# Patient Record
Sex: Female | Born: 1965 | Race: White | Hispanic: No | Marital: Married | Smoking: Never smoker
Health system: Southern US, Community
[De-identification: ages and names within clinical notes are randomized; demographics above are authoritative.]

---
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
## 1997-07-24 ENCOUNTER — Other Ambulatory Visit: Admission: RE | Admit: 1997-07-24 | Discharge: 1997-07-24 | Payer: Self-pay | Admitting: Obstetrics & Gynecology

## 2000-03-29 ENCOUNTER — Other Ambulatory Visit: Admission: RE | Admit: 2000-03-29 | Discharge: 2000-03-29 | Payer: Self-pay | Admitting: Obstetrics & Gynecology

## 2000-10-28 ENCOUNTER — Ambulatory Visit (HOSPITAL_COMMUNITY): Admission: RE | Admit: 2000-10-28 | Discharge: 2000-10-28 | Payer: Self-pay | Admitting: Obstetrics & Gynecology

## 2001-01-20 ENCOUNTER — Inpatient Hospital Stay (HOSPITAL_COMMUNITY): Admission: AD | Admit: 2001-01-20 | Discharge: 2001-01-22 | Payer: Self-pay | Admitting: Obstetrics & Gynecology

## 2001-02-18 ENCOUNTER — Other Ambulatory Visit: Admission: RE | Admit: 2001-02-18 | Discharge: 2001-02-18 | Payer: Self-pay | Admitting: Obstetrics & Gynecology

## 2002-03-13 ENCOUNTER — Other Ambulatory Visit: Admission: RE | Admit: 2002-03-13 | Discharge: 2002-03-13 | Payer: Self-pay | Admitting: Obstetrics & Gynecology

## 2003-04-09 ENCOUNTER — Other Ambulatory Visit: Admission: RE | Admit: 2003-04-09 | Discharge: 2003-04-09 | Payer: Self-pay | Admitting: Obstetrics & Gynecology

## 2004-04-14 ENCOUNTER — Other Ambulatory Visit: Admission: RE | Admit: 2004-04-14 | Discharge: 2004-04-14 | Payer: Self-pay | Admitting: Obstetrics & Gynecology

## 2005-05-26 ENCOUNTER — Other Ambulatory Visit: Admission: RE | Admit: 2005-05-26 | Discharge: 2005-05-26 | Payer: Self-pay | Admitting: Obstetrics & Gynecology

## 2015-06-06 ENCOUNTER — Ambulatory Visit (INDEPENDENT_AMBULATORY_CARE_PROVIDER_SITE_OTHER): Payer: BLUE CROSS/BLUE SHIELD | Admitting: Medical

## 2015-06-06 ENCOUNTER — Encounter: Payer: Self-pay | Admitting: Medical

## 2015-06-06 VITALS — BP 110/76 | HR 88 | Temp 97.9°F | Ht 67.0 in | Wt 183.2 lb

## 2015-06-06 DIAGNOSIS — R51 Headache: Secondary | ICD-10-CM

## 2015-06-06 DIAGNOSIS — G44209 Tension-type headache, unspecified, not intractable: Secondary | ICD-10-CM

## 2015-06-06 DIAGNOSIS — R519 Headache, unspecified: Secondary | ICD-10-CM | POA: Insufficient documentation

## 2015-06-06 MED ORDER — DICLOFENAC SODIUM 75 MG PO TBEC: DELAYED_RELEASE_TABLET | ORAL | Status: AC

## 2015-06-06 MED ORDER — KETOROLAC TROMETHAMINE 60 MG/2ML IM SOLN
60.0000 mg | Freq: Once | INTRAMUSCULAR | Status: AC
  Administered 2015-06-06: 60 mg via INTRAMUSCULAR

## 2015-06-06 MED ORDER — CYCLOBENZAPRINE HCL 10 MG PO TABS: ORAL_TABLET | ORAL | Status: AC

## 2015-06-06 NOTE — Progress Notes (Signed)
Subjective:    Patient ID: Adrienne Reynolds, female    DOB: Mar 02, 1966, 49 y.o.   MRN: [id number]  HPI   I have reviewed pt PMH, PSH, FH, Social History and Surgical History.  Pt [employer]), Exercise 2 times a week on the treadmill, Caffeinated beverages 2 a day, admits to eating out a lot, married- 2 children.   Pt in with ha recently for 10 days. She states pain is on back of head region. Pt thinks maybe tension ha. She can feel trapezius muscles tight. She does admit to worrying about everything. Stressed about son moving out of house. Also stressed over holidays and very busy at work.  Not sleeping. Pt states some ha associated with pt cycle last 4 months. Ha have bee usually week of her period.   LMP - was christmas day. Last year cycle have only been 2-3 days.   Pt last labs complete physical and lab work 8-9 years.  Pt up to date pap smear and mammogram.  Excedrin migraine by decreased ha about 50% the other day.    Review of Systems  Constitutional: Negative for fever, chills, diaphoresis, activity change and fatigue.  Respiratory: Negative for cough, chest tightness and shortness of breath.   Cardiovascular: Negative for chest pain, palpitations and leg swelling.  Gastrointestinal: Negative for nausea, vomiting and abdominal pain.  Musculoskeletal: Negative for neck pain and neck stiffness.  Neurological: Negative for dizziness, syncope, facial asymmetry, weakness, light-headedness and headaches.  Hematological: Negative for adenopathy. Does not bruise/bleed easily.  Psychiatric/Behavioral: Negative for behavioral problems, confusion and agitation. The patient is not nervous/anxious.     History reviewed. No pertinent past medical history.  Social History   Social History  . Marital Status: Married    Spouse Name: N/A  . Number of Children: N/A  . Years of Education: N/A   Occupational History  . Not on file.   Social History Main Topics  .  Smoking status: Never Smoker   . Smokeless tobacco: Never Used  . Alcohol Use: 0.0 oz/week    0 Standard drinks or equivalent per week     Comment: occasionally wine. 2 glasses of wine a month.  . Drug Use: No  . Sexual Activity: Yes   Other Topics Concern  . Not on file   Social History Narrative  . No narrative on file    Past Surgical History  Procedure Laterality Date  . Cesarean section      05/08/1996  . Cesarean section      01/06/2001  . Tonsilectomy, adenoidectomy, bilateral myringotomy and tubes  1973    Family History  Problem Relation Age of Onset  . Thyroid disease Mother   . Colon cancer Mother   . Arthritis/Rheumatoid Mother   . AAA (abdominal aortic aneurysm) Mother   . Hypertension Father     No Known Allergies  No current outpatient prescriptions on file prior to visit.   No current facility-administered medications on file prior to visit.    BP 110/76 mmHg  Pulse 88  Temp(Src) 97.9 F (36.6 C) (Oral)  Ht 5\' 7"  (1.702 m)  Wt 183 lb 3.2 oz (83.099 kg)  BMI 28.69 kg/m2  SpO2 99%  LMP 05/30/2015       Objective:   Physical Exam  General Mental Status- Alert. General Appearance- Not in acute distress.   Skin General: Color- Normal Color. Moisture- Normal Moisture. Temporal area normal. No veins dilated. Nontender.  Neck Carotid Arteries-  Normal color. Moisture- Normal Moisture. No carotid bruits. No JVD. Both sides trapezius tender. Worse on left side. Pain where trapezius inserts into occipital area.  Chest and Lung Exam Auscultation: Breath Sounds:-Normal.  Cardiovascular Auscultation:Rythm- Regular. Murmurs & Other Heart Sounds:Auscultation of the heart reveals- No Murmurs.  Abdomen Inspection:-Inspeection Normal. Palpation/Percussion:Note:No mass. Palpation and Percussion of the abdomen reveal- Non Tender, Non Distended + BS, no rebound or guarding.    Neurologic Cranial Nerve exam:- CN III-XII intact(No nystagmus),  symmetric smile. Drift Test:- No drift. Romberg Exam:- Negative.  Finger to Nose:- Normal/Intact Strength:- 5/5 equal and symmetric strength both upper and lower extremities.      Assessment & Plan:  By history description  and exam you do appear to have tension ha.  We gave you toradol 60 mg im today.  Tonight your could also take cyclobenzaprine muscle relaxant.  I will prescribe diclofenac for mild ha if you have during the day. And if at end of the day you feel neck muscles tightness then try muscle relaxant.  We need to follow your response to med to help determine if imaging of head indicated or if neurologist referral indicated.  If you get HA with any neurologic signs or symptoms as discussed then ED evaluation.  If you get ha with severe stiff neck then ED evaluation as well.  Follow up in one week or as needed.  Also recommend in near future month or so scheduling for complete physical to include fasting labs.

## 2015-06-06 NOTE — Patient Instructions (Addendum)
By history description  and exam you do appear to have tension ha.  We gave you toradol 60 mg im today.  Tonight your could also take cyclobenzaprine muscle relaxant.  I will prescribe diclofenac for mild ha if you have during the day. And if at end of the day you feel neck muscles tightness then try muscle relaxant.  We need to follow your response to med to help determine if imaging of head indicated or if neurologist referral indicated.  If you get HA with any neurologic signs or symptoms as discussed then ED evaluation.  If you get ha with severe stiff neck then ED evaluation as well.  Follow up in one week or as needed.  Also recommend in near future month or so scheduling for complete physical to include fasting labs.

## 2015-06-06 NOTE — Progress Notes (Signed)
Pre visit review using our clinic review tool, if applicable. No additional management support is needed unless otherwise documented below in the visit note. 

## 2015-06-13 ENCOUNTER — Ambulatory Visit (INDEPENDENT_AMBULATORY_CARE_PROVIDER_SITE_OTHER): Payer: BLUE CROSS/BLUE SHIELD | Admitting: Medical

## 2015-06-13 ENCOUNTER — Encounter: Payer: Self-pay | Admitting: Medical

## 2015-06-13 VITALS — BP 112/70 | HR 81 | Temp 97.7°F | Ht 67.0 in | Wt 182.8 lb

## 2015-06-13 DIAGNOSIS — G44209 Tension-type headache, unspecified, not intractable: Secondary | ICD-10-CM | POA: Diagnosis not present

## 2015-06-13 NOTE — Progress Notes (Signed)
Subjective:    Patient ID: Adrienne Reynolds, female    DOB: [birth year], 50 y.o.   MRN: [id number]  HPI  Pt in for follow up.  Pt was in last time and  she had likely tension ha. Pt did respond to toradol. Took about 12 hours to stop the ha.   However she  states at end of each day she will have low level ha. Recenlty level 1/10 ha. No migraine features associated and not gross motor or sensory function deficits.   Before most recent severe ha(last visit) she only had mild brief ha  twice a year.(lasting briefly)  Pt has been very stressed having [place] [facility] [place] and stressed with work busy season.  I did give cyclobenzaprine for 4 nights. She slept well but needed to cut dose in half due to feeling oversedated.   Pt states each day after will feel ha toward end of day level 1/10.  Take diclofenac one time a day  Pt in past had ct of head 10 yrs ago and only showed sinusitis.  Pt does get massage every 8 wks. She will get massage next thursday    Review of Systems  Constitutional: Negative for fever, chills, diaphoresis, activity change and fatigue.  Respiratory: Negative for cough, chest tightness and shortness of breath.   Cardiovascular: Negative for chest pain, palpitations and leg swelling.  Gastrointestinal: Negative for nausea, vomiting and abdominal pain.  Musculoskeletal: Negative for neck pain and neck stiffness.  Neurological: Negative for dizziness, weakness and headaches.       No current ha. She has slight ha after work today. She took one excedrin and stopped completley. Again was 1/10 level ha.  Psychiatric/Behavioral: Negative for behavioral problems, confusion and agitation. The patient is not nervous/anxious.     No past medical history on file.  Social History   Social History  . Marital Status: Married    Spouse Name: N/A  . Number of Children: N/A  . Years of Education: N/A   Occupational History  . Not on file.   Social  History Main Topics  . Smoking status: Never Smoker   . Smokeless tobacco: Never Used  . Alcohol Use: 0.0 oz/week    0 Standard drinks or equivalent per week     Comment: occasionally wine. 2 glasses of wine a month.  . Drug Use: No  . Sexual Activity: Yes   Other Topics Concern  . Not on file   Social History Narrative    Past Surgical History  Procedure Laterality Date  . Cesarean section      05/08/1996  . Cesarean section      01/06/2001  . Tonsilectomy, adenoidectomy, bilateral myringotomy and tubes  1973    Family History  Problem Relation Age of Onset  . Thyroid disease Mother   . Colon cancer Mother   . Arthritis/Rheumatoid Mother   . AAA (abdominal aortic aneurysm) Mother   . Hypertension Father     No Known Allergies  Current Outpatient Prescriptions on File Prior to Visit  Medication Sig Dispense Refill  . cyclobenzaprine (FLEXERIL) 10 MG tablet 1 tab po q hs prn trapezius pain with ha 10 tablet 0  . diclofenac (VOLTAREN) 75 MG EC tablet 1 tab po bid prn headache 14 tablet 0  . TRI-PREVIFEM 0.18/0.215/0.25 MG-35 MCG tablet TAKE 1 TABLET DAILY AS DIRECTED.  3   No current facility-administered medications on file prior to visit.    BP 112/70  mmHg  Pulse 81  Temp(Src) 97.7 F (36.5 C) (Oral)  Ht 5\' 7"  (1.702 m)  Wt 182 lb 12.8 oz (82.918 kg)  BMI 28.62 kg/m2  SpO2 98%  LMP 05/30/2015       Objective:   Physical Exam   General Mental Status- Alert. General Appearance- Not in acute distress.   Skin General: Color- Normal Color. Moisture- Normal Moisture.  Neck Carotid Arteries- Normal color. Moisture- Normal Moisture. No carotid bruits. No JVD.  Chest and Lung Exam Auscultation: Breath Sounds:-Normal.  Cardiovascular Auscultation:Rythm- Regular. Murmurs & Other Heart Sounds:Auscultation of the heart reveals- No Murmurs.   Neurologic Cranial Nerve exam:- CN III-XII intact(No nystagmus), symmetric smile. Strength:- 5/5 equal and  symmetric strength both upper and lower extremities.      Assessment & Plan:  Your ha is much improved but still present low grade on daily basis. I think as you adjust to stress related to your son being at college and you get through work busy season that ha should subside.  Continue to use diclofenac one time a day. And cyclobenzaprine as needed.  I do want to see you have complete ha free days. I am curious to see how you will respond to your massage next Thursday. Please update me around  January 17th how you are doing. I am still considering getting imaging studies.   Also recommend setting up wellness exam.  Note pt is aware that if ha increases/severe of ha with neurologic signs or symptoms as discussed then ED evaluation.

## 2015-06-13 NOTE — Patient Instructions (Signed)
Your ha is much improved but still present low grade on daily basis. I think as you adjust to stress related to your son being at college and you get through work busy season that ha should subside.  Continue to use diclofenac one time a day. And cyclobenzaprine as needed.  I do want to see you have complete ha free days. I am curious to see how you will respond to your massage next Thursday. Please update me around  January 17th how you are doing. I am still considering getting imaging studies.   Also recommend setting up wellness exam.

## 2015-06-13 NOTE — Progress Notes (Signed)
Pre visit review using our clinic review tool, if applicable. No additional management support is needed unless otherwise documented below in the visit note. 

## 2015-07-31 ENCOUNTER — Encounter: Payer: BLUE CROSS/BLUE SHIELD | Admitting: Medical

## 2015-10-20 ENCOUNTER — Emergency Department (HOSPITAL_BASED_OUTPATIENT_CLINIC_OR_DEPARTMENT_OTHER): Payer: BLUE CROSS/BLUE SHIELD

## 2015-10-20 ENCOUNTER — Emergency Department (HOSPITAL_BASED_OUTPATIENT_CLINIC_OR_DEPARTMENT_OTHER)
Admission: EM | Admit: 2015-10-20 | Discharge: 2015-10-20 | Disposition: A | Discharge status: Home / Self Care | Payer: BLUE CROSS/BLUE SHIELD | Attending: Emergency Medicine | Admitting: Emergency Medicine

## 2015-10-20 ENCOUNTER — Encounter (HOSPITAL_BASED_OUTPATIENT_CLINIC_OR_DEPARTMENT_OTHER): Payer: Self-pay | Admitting: Emergency Medicine

## 2015-10-20 DIAGNOSIS — M25512 Pain in left shoulder: Secondary | ICD-10-CM | POA: Insufficient documentation

## 2015-10-20 DIAGNOSIS — M542 Cervicalgia: Secondary | ICD-10-CM | POA: Diagnosis not present

## 2015-10-20 DIAGNOSIS — R0789 Other chest pain: Secondary | ICD-10-CM | POA: Insufficient documentation

## 2015-10-20 DIAGNOSIS — R079 Chest pain, unspecified: Secondary | ICD-10-CM

## 2015-10-20 DIAGNOSIS — R0602 Shortness of breath: Secondary | ICD-10-CM | POA: Insufficient documentation

## 2015-10-20 LAB — CBC
HEMATOCRIT: 42.5 % (ref 36.0–46.0)
Hemoglobin: 14.3 g/dL (ref 12.0–15.0)
MCH: 29.3 pg (ref 26.0–34.0)
MCHC: 33.6 g/dL (ref 30.0–36.0)
MCV: 87.1 fL (ref 78.0–100.0)
PLATELETS: 234 10*3/uL (ref 150–400)
RBC: 4.88 MIL/uL (ref 3.87–5.11)
RDW: 12.7 % (ref 11.5–15.5)
WBC: 5.3 10*3/uL (ref 4.0–10.5)

## 2015-10-20 LAB — COMPREHENSIVE METABOLIC PANEL
ALT: 16 U/L (ref 14–54)
ANION GAP: 10 (ref 5–15)
AST: 22 U/L (ref 15–41)
Albumin: 4.2 g/dL (ref 3.5–5.0)
Alkaline Phosphatase: 70 U/L (ref 38–126)
BILIRUBIN TOTAL: 0.4 mg/dL (ref 0.3–1.2)
BUN: 12 mg/dL (ref 6–20)
CO2: 26 mmol/L (ref 22–32)
Calcium: 9.4 mg/dL (ref 8.9–10.3)
Chloride: 103 mmol/L (ref 101–111)
Creatinine, Ser: 0.81 mg/dL (ref 0.44–1.00)
Glucose, Bld: 84 mg/dL (ref 65–99)
POTASSIUM: 3.8 mmol/L (ref 3.5–5.1)
Sodium: 139 mmol/L (ref 135–145)
TOTAL PROTEIN: 8.1 g/dL (ref 6.5–8.1)

## 2015-10-20 LAB — TROPONIN I

## 2015-10-20 MED ORDER — ASPIRIN 81 MG PO CHEW
324.0000 mg | CHEWABLE_TABLET | Freq: Once | ORAL | Status: AC
Start: 2015-10-20 — End: 2015-10-20
  Administered 2015-10-20: 324 mg via ORAL
  Filled 2015-10-20: qty 4

## 2015-10-20 NOTE — ED Notes (Addendum)
Pt c/o chest pain since yesterday, radiating to L arm and neck. Pt denies N/V. Pt states pain is worse with movement. Pt c/o intermittent SOB with exertion. Recent airplane travel.

## 2015-10-20 NOTE — ED Provider Notes (Signed)
CSN: [id number]     Arrival date & time 10/20/15  1809 History  By signing my name below, I, Bethel BornBritney McCollum, attest that this documentation has been prepared under the direction and in the presence of Melene Planan Victoire Deans, DO. Electronically Signed: Bethel BornBritney McCollum, ED Scribe. 10/20/2015. 9:43 PM   Chief Complaint  Patient presents with  . Chest Pain    The history is provided by the patient. No language interpreter was used.   Adrienne Reynolds is a 50 y.o. female who presents to the Emergency Department complaining of intermittent, pressure-like, mid sided chest pain with onset yesterday morning. Pt states that her pain started at the left side of the neck and then radiated to the arm before settling in the chest. The pain is worse with movement.  Associated symptoms include sweating, intermittent SOB. Pt denies nausea, fever, cough, hemoptysis, and LE swelling or pain. No history of CAD, HTN, DM. She does not smoke. No family history of heart disease. No history of DVT/PE.  She is on Tri-Previfem (Ethinyl estradiol and norgestimate) for birth control. Pt denies smoking. No recent surgery. Pt states that she recently flew to [place] but has had no other recent travel.   History reviewed. No pertinent past medical history. Past Surgical History  Procedure Laterality Date  . Cesarean section      05/08/1996  . Cesarean section      01/06/2001  . Tonsilectomy, adenoidectomy, bilateral myringotomy and tubes  1973   Family History  Problem Relation Age of Onset  . Thyroid disease Mother   . Colon cancer Mother   . Arthritis/Rheumatoid Mother   . AAA (abdominal aortic aneurysm) Mother   . Hypertension Father    Social History  Substance Use Topics  . Smoking status: Never Smoker   . Smokeless tobacco: Never Used  . Alcohol Use: 0.0 oz/week    0 Standard drinks or equivalent per week     Comment: occasionally wine. 2 glasses of wine a month.   OB History    No data available     Review of  Systems  Cardiovascular: Positive for chest pain.  All other systems reviewed and are negative.   Allergies  Review of patient's allergies indicates no known allergies.  Home Medications   Prior to Admission medications   Medication Sig Start Date End Date Taking? Authorizing Provider  cyclobenzaprine (FLEXERIL) 10 MG tablet 1 tab po q hs prn trapezius pain with ha 06/06/15   Esperanza RichtersEdward Saguier, PA-C  diclofenac (VOLTAREN) 75 MG EC tablet 1 tab po bid prn headache 06/06/15   Edward Saguier, PA-C  TRI-PREVIFEM 0.18/0.215/0.25 MG-35 MCG tablet TAKE 1 TABLET DAILY AS DIRECTED. 03/31/15   Historical Provider, MD   BP 144/79 mmHg  Pulse 86  Temp(Src) 98.7 F (37.1 C) (Oral)  Resp 20  Ht 5\' 5"  (1.651 m)  Wt 170 lb (77.111 kg)  BMI 28.29 kg/m2  SpO2 99%  LMP 10/20/2015 Physical Exam  Constitutional: She is oriented to person, place, and time. She appears well-developed and well-nourished. No distress.  HENT:  Head: Normocephalic and atraumatic.  Eyes: EOM are normal. Pupils are equal, round, and reactive to light.  Neck: Normal range of motion. Neck supple.  Cardiovascular: Normal rate and regular rhythm.  Exam reveals no gallop and no friction rub.   No murmur heard. Equal upper extremity pulses   Pulmonary/Chest: Effort normal. She has no wheezes. She has no rales.  Abdominal: Soft. She exhibits no distension. There is tenderness (  mild) in the epigastric area.  Musculoskeletal: She exhibits no edema or tenderness.  No peripheral edema   Neurological: She is alert and oriented to person, place, and time.  Skin: Skin is warm and dry. She is not diaphoretic.  Psychiatric: She has a normal mood and affect. Her behavior is normal.  Nursing note and vitals reviewed.   ED Course  Procedures (including critical care time) DIAGNOSTIC STUDIES: Oxygen Saturation is 99% on RA,  normal by my interpretation.    COORDINATION OF CARE: 6:51 PM Discussed treatment plan which includes lab  work, CXR, EKG, aspirin, and NTG with pt at bedside and pt agreed to plan.  Labs Review Labs Reviewed  CBC  COMPREHENSIVE METABOLIC PANEL  TROPONIN I  TROPONIN I    Imaging Review Dg Chest 2 View  10/20/2015  CLINICAL DATA:  Initial evaluation for acute chest pain. EXAM: CHEST  2 VIEW COMPARISON:  None. FINDINGS: The heart size and mediastinal contours are within normal limits. Both lungs are clear. The visualized skeletal structures are unremarkable. IMPRESSION: No active cardiopulmonary disease. Electronically Signed   By: Rise Mu M.D.   On: 10/20/2015 22:08   I personally reviewed and evaluated these images and lab results as a part of my medical decision-making.   EKG Interpretation   Date/Time:  Sunday Oct 20 2015 18:20:27 EDT Ventricular Rate:  73 PR Interval:  134 QRS Duration: 86 QT Interval:  384 QTC Calculation: 423 R Axis:   78 Text Interpretation:  Normal sinus rhythm Normal ECG No old tracing to  compare Confirmed by Diya Gervasi MD, DANIEL [phone]) on 10/20/2015 6:41:28 PM      MDM   Final diagnoses:  Chest pain, unspecified chest pain type    50 yo F With a chief complaint of chest pain. This initially started in her neck and then she felt moved into her chest. Feel that her story is atypical of ACS. Initial troponin is negative. EKG is unremarkable. Wells score of 0. Unable toPERC with ocp use.  Feel unlikely with stable vitals no hypoxia and low wells score will not seek further eval.    Delta trop negative.   11:06 PM:  I have discussed the diagnosis/risks/treatment options with the patient and family and believe the pt to be eligible for discharge home to follow-up with PCP. We also discussed returning to the ED immediately if new or worsening sx occur. We discussed the sx which are most concerning (e.g., sudden worsening pain, fever, inability to tolerate by mouth) that necessitate immediate return. Medications administered to the patient during their  visit and any new prescriptions provided to the patient are listed below.  Medications given during this visit Medications  aspirin chewable tablet 324 mg (324 mg Oral Given 10/20/15 1831)    New Prescriptions   No medications on file    The patient appears reasonably screen and/or stabilized for discharge and I doubt any other medical condition or other [facility] requiring further screening, evaluation, or treatment in the ED at this time prior to discharge.     I personally performed the services described in this documentation, which was scribed in my presence. The recorded information has been reviewed and is accurate.    Melene Plan, DO 10/20/15 2306

## 2015-10-20 NOTE — Discharge Instructions (Signed)

## 2015-10-20 NOTE — ED Notes (Signed)
Pt states she woke up yesterday with left side neck pain, shouldr pain and chest pain.  Came and went throughtout the day - worse with activity.  Pt states today the pain has become more of a "pressure" feeling in left chest.

## 2015-10-20 NOTE — ED Notes (Signed)
Pt alert, NAD, calm, interactive, resps e/u, speaking in clear complete sentences, no dyspnea noted, skin W&D, VSS, reports continued mild mid chest pressure, (denies: sob, nv, dizziness or other sx), [facility]

## 2015-11-18 ENCOUNTER — Other Ambulatory Visit: Payer: Self-pay | Admitting: Obstetrics & Gynecology

## 2015-11-18 DIAGNOSIS — R928 Other abnormal and inconclusive findings on diagnostic imaging of breast: Secondary | ICD-10-CM

## 2015-11-22 ENCOUNTER — Ambulatory Visit
Admission: RE | Admit: 2015-11-22 | Discharge: 2015-11-22 | Disposition: A | Discharge status: Home / Self Care | Payer: BLUE CROSS/BLUE SHIELD | Source: Ambulatory Visit | Attending: Obstetrics & Gynecology | Admitting: Obstetrics & Gynecology

## 2015-11-22 ENCOUNTER — Other Ambulatory Visit: Payer: BLUE CROSS/BLUE SHIELD

## 2015-11-22 DIAGNOSIS — R928 Other abnormal and inconclusive findings on diagnostic imaging of breast: Secondary | ICD-10-CM

## 2018-01-04 IMAGING — MG 2D DIGITAL DIAGNOSTIC UNILATERAL RIGHT MAMMOGRAM WITH CAD AND AD
8 series · 8 of 20 positions shown · non-contrast
Comparison: 11/11/2015 and earlier

CLINICAL DATA: The patient returns after screening study for
evaluation of possible right breast distortion.

EXAM:
2D DIGITAL DIAGNOSTIC UNILATERAL RIGHT MAMMOGRAM WITH CAD AND
ADJUNCT TOMO

[R MLO (1 of 2)]
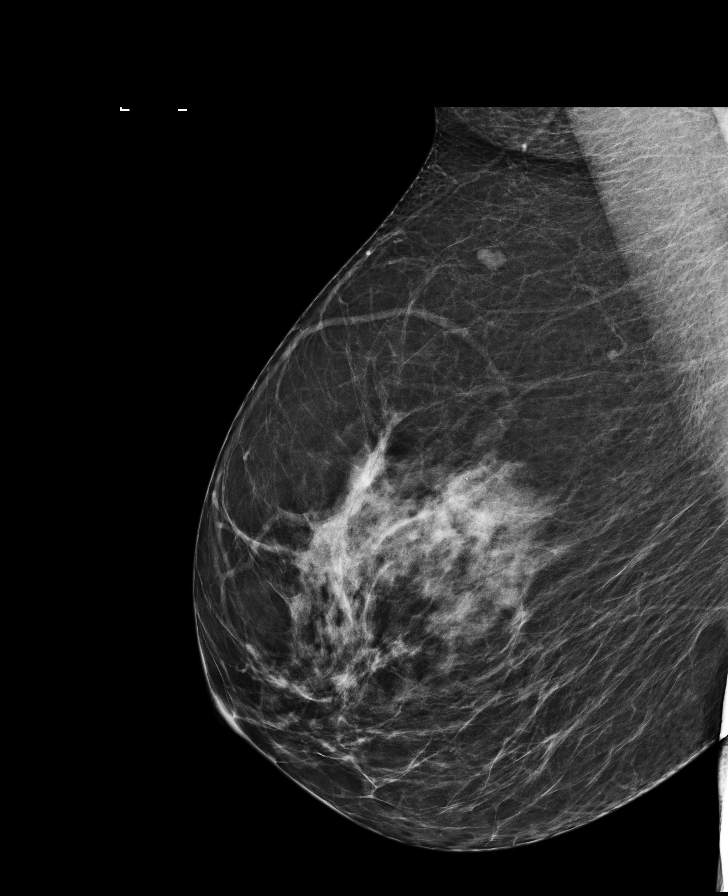

[R CC synth-2D]
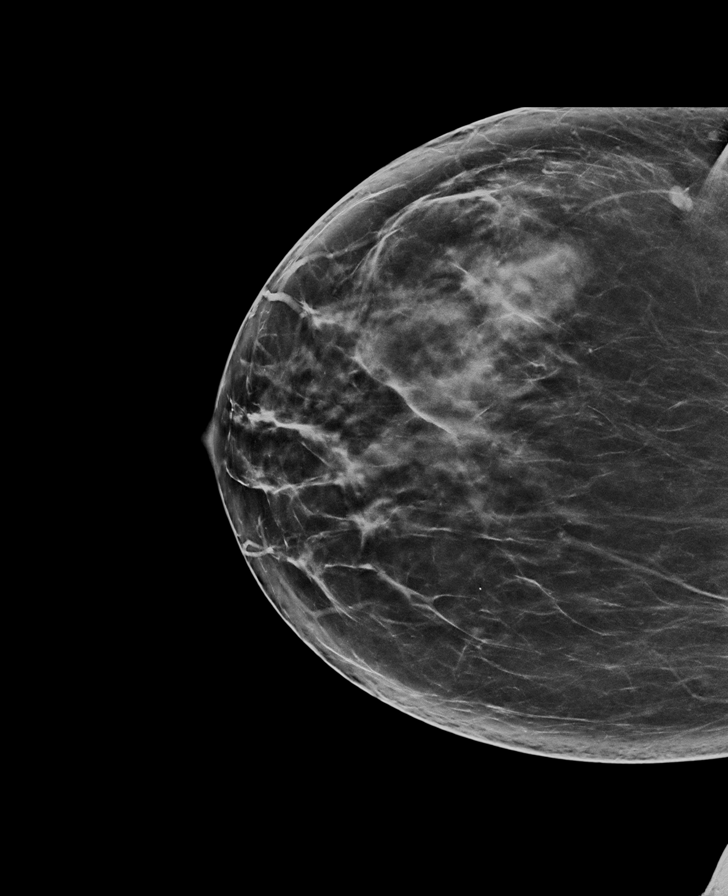

[R MLO synth-2D]
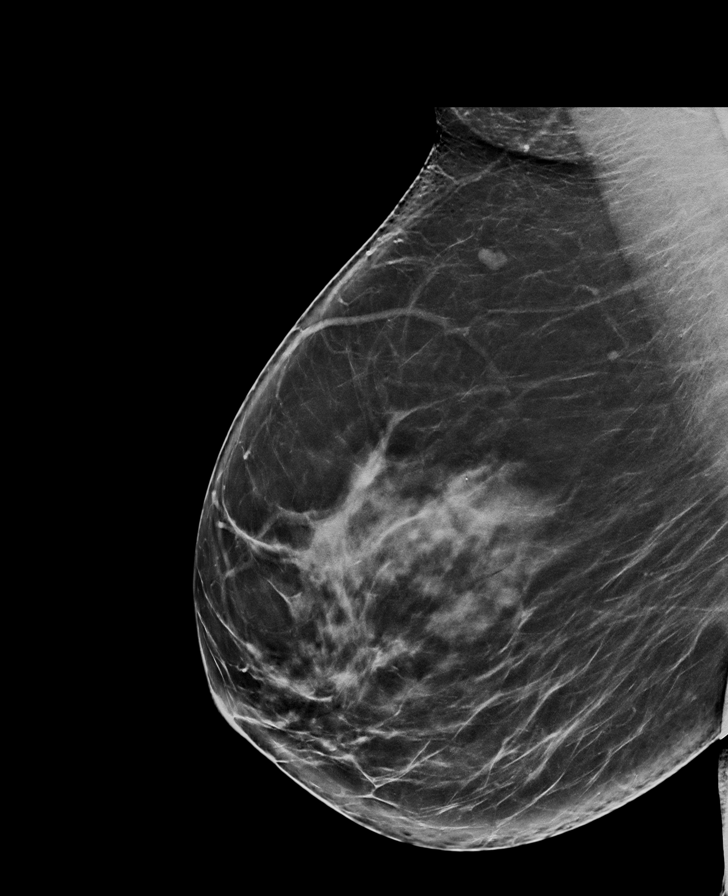

[R MLO (2 of 2)]
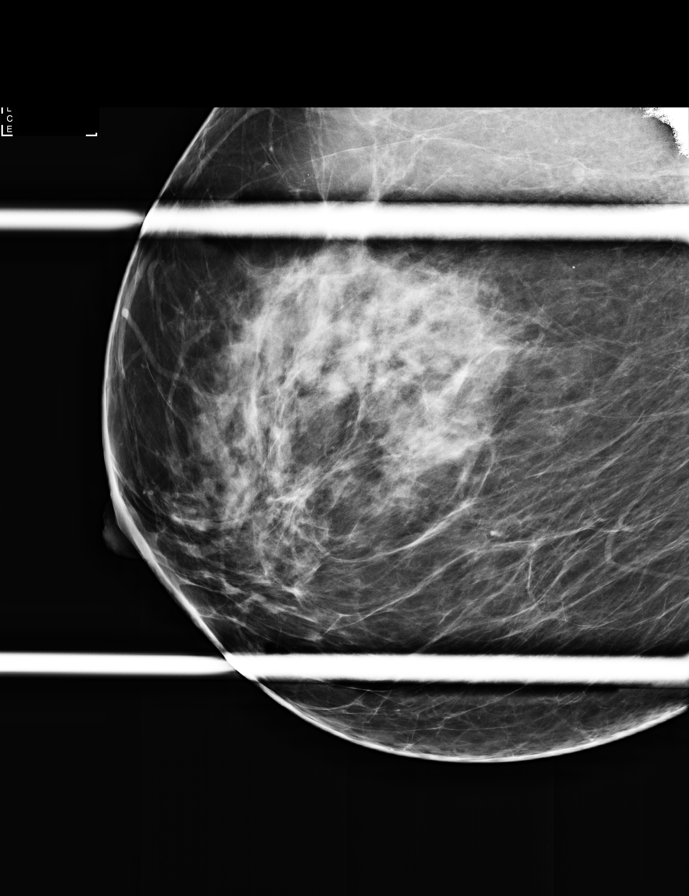

[R CC]
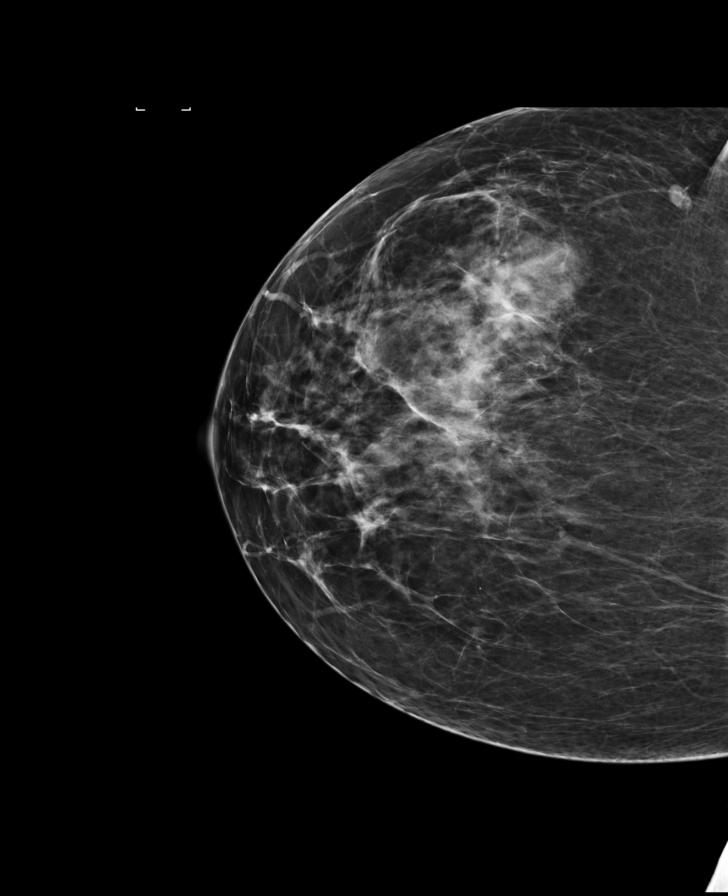

[R CC tomo · tomo slice 40/79.0]
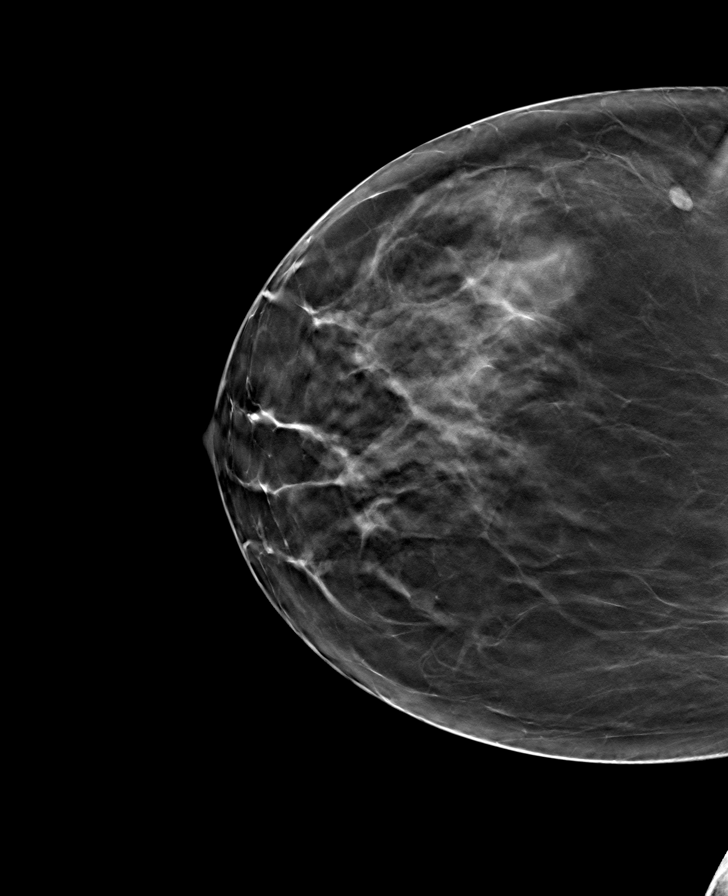

[R MLO tomo (1 of 2) · tomo slice 43/85.0]
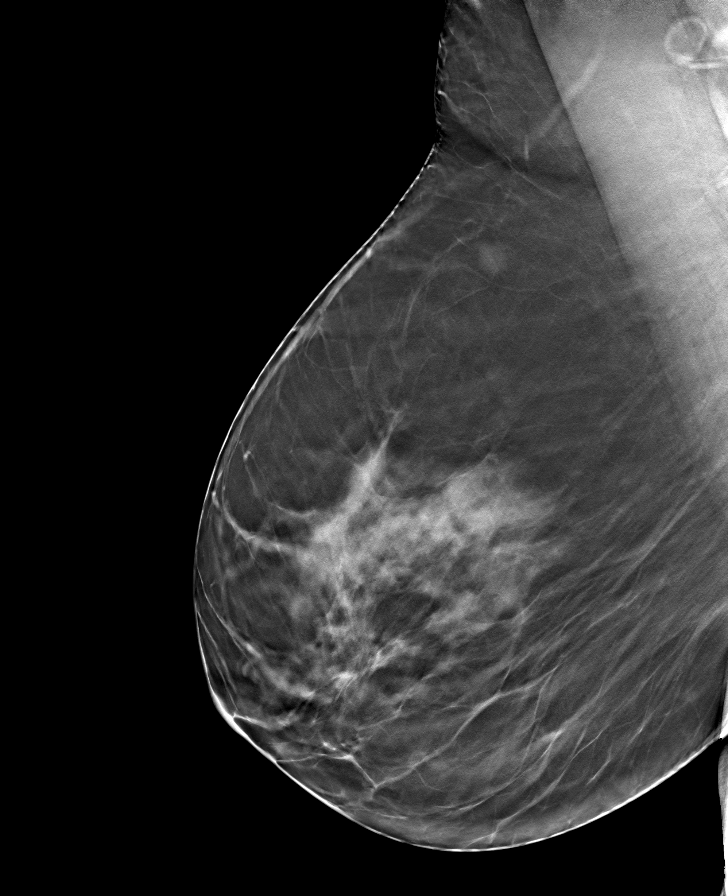

[R MLO tomo (2 of 2) · tomo slice 37/72.0]
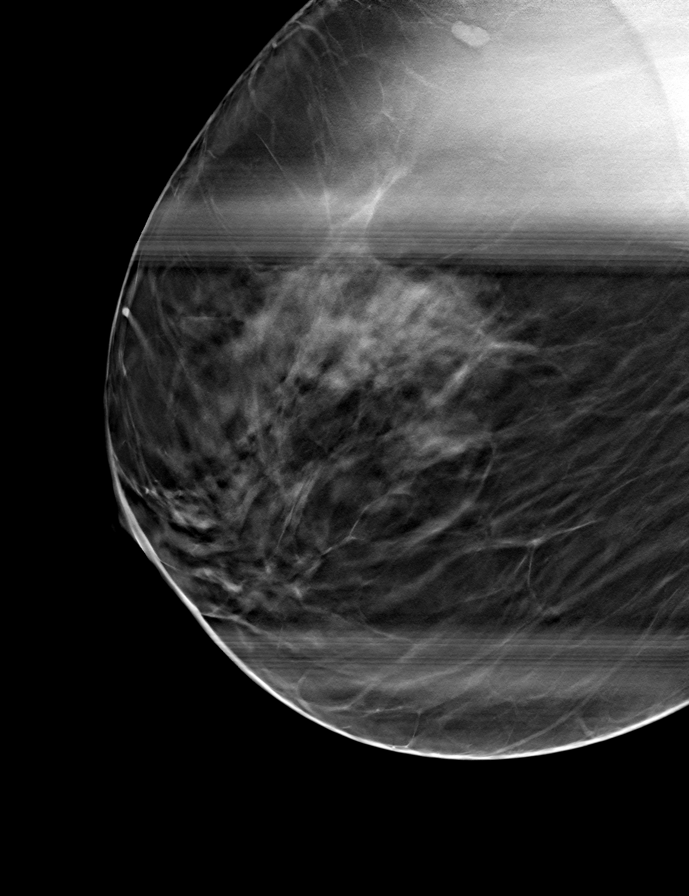

[8 of 20 positions shown; findings below may reference images not displayed]

ACR Breast Density Category c: The breast tissue is heterogeneously
dense, which may obscure small masses.
FINDINGS: Additional views are performed using tomosynthesis and demonstrate
no persistent distortion in the upper central portion of the right
breast. No suspicious mass, distortion, or microcalcifications are
identified to suggest presence of malignancy.

Mammographic images were processed with CAD.
IMPRESSION: No persistent abnormality on further evaluation of the right breast.

RECOMMENDATION:
Screening mammogram in one year.(Code:2A-B-A70)

I have discussed the findings and recommendations with the patient.
Results were also provided in writing at the conclusion of the
visit. If applicable, a reminder letter will be sent to the patient
regarding the next appointment.

BI-RADS CATEGORY  1: Negative.
